# Patient Record
Sex: Male | Born: 1980 | Hispanic: No | Marital: Married | Smoking: Never smoker
Health system: Southern US, Community
[De-identification: ages and names within clinical notes are randomized; demographics above are authoritative.]

## PROBLEM LIST (undated history)

## (undated) DIAGNOSIS — Z87442 Personal history of urinary calculi: Secondary | ICD-10-CM

## (undated) DIAGNOSIS — E785 Hyperlipidemia, unspecified: Secondary | ICD-10-CM

## (undated) DIAGNOSIS — I10 Essential (primary) hypertension: Secondary | ICD-10-CM

## (undated) HISTORY — DX: Hyperlipidemia, unspecified: E78.5

## (undated) HISTORY — DX: Essential (primary) hypertension: I10

## (undated) HISTORY — DX: Personal history of urinary calculi: Z87.442

---

## 2018-01-18 DIAGNOSIS — Z87442 Personal history of urinary calculi: Secondary | ICD-10-CM | POA: Insufficient documentation

## 2018-01-18 DIAGNOSIS — I10 Essential (primary) hypertension: Secondary | ICD-10-CM | POA: Insufficient documentation

## 2021-03-11 ENCOUNTER — Encounter: Payer: Self-pay | Admitting: Family Medicine

## 2021-03-11 ENCOUNTER — Ambulatory Visit (INDEPENDENT_AMBULATORY_CARE_PROVIDER_SITE_OTHER): Payer: Managed Care, Other (non HMO) | Admitting: Family Medicine

## 2021-03-11 ENCOUNTER — Other Ambulatory Visit: Payer: Self-pay

## 2021-03-11 ENCOUNTER — Ambulatory Visit (INDEPENDENT_AMBULATORY_CARE_PROVIDER_SITE_OTHER): Payer: Managed Care, Other (non HMO)

## 2021-03-11 VITALS — BP 167/99 | HR 60 | Temp 97.9°F | Ht 64.96 in | Wt 190.8 lb

## 2021-03-11 DIAGNOSIS — I10 Essential (primary) hypertension: Secondary | ICD-10-CM | POA: Diagnosis not present

## 2021-03-11 DIAGNOSIS — R0789 Other chest pain: Secondary | ICD-10-CM | POA: Insufficient documentation

## 2021-03-11 MED ORDER — AMLODIPINE BESYLATE 10 MG PO TABS: 10.0000 mg | ORAL_TABLET | Freq: Every day | ORAL | 1 refills | Status: AC

## 2021-03-11 MED ORDER — MELOXICAM 15 MG PO TABS: 15.0000 mg | ORAL_TABLET | Freq: Every day | ORAL | 1 refills | Status: AC | PRN

## 2021-03-11 MED ORDER — OLMESARTAN MEDOXOMIL 20 MG PO TABS: 20.0000 mg | ORAL_TABLET | Freq: Every day | ORAL | 1 refills | Status: DC

## 2021-03-11 NOTE — Patient Instructions (Signed)
Nice to meet you today!  Continue amlodipine for blood pressure  Start olmesartan as well for blood pressure  Use meloxicam daily as needed for pain.   We'll be in touch with lab and xray results.   See me again in 4-6 weeks for BP recheck.

## 2021-03-11 NOTE — Assessment & Plan Note (Signed)
Previous rib injury.  Some TTP along R ribs.  CXR ordered and will provide trial of meloxicam.

## 2021-03-11 NOTE — Assessment & Plan Note (Signed)
BP elevated today.  Continue amlodipine.  Adding olmesartan 20mg  Counseled on low sodium diet.  Return in about 5 weeks (around 04/15/2021) for BP/Rib follow up.

## 2021-03-11 NOTE — Progress Notes (Signed)
Randall Mcneil - 40 y.o. male MRN [id number]  Date of birth: 12-28-1980  Subjective Chief Complaint  Patient presents with  . Establish Care   Due to language barrier, an interpreter was present during the history-taking and subsequent discussion (and for part of the physical exam) with this patient.  HPI Randall Mcneil is a 40 y.o. male here today for initial visit to establish care.  He has a history of HTN.  He has complaint of rib and side pain as well.   Reports falling from a ladder in 2005.  May have broken ribs on the R side at that time but xrays were indeterminate.  He has had recurrent pain along the R lower ribs since that time. This is worse when bending over or leaning against something.  He also has occasional R sided abdominal pain.  Denies nausea or vomiting, changes to appetite or changes to bowels.  No worsening or improvement with eating.   HTN currently treated with amlodipine 10mg  daily.  BP at home remains elevated and is elevated in clinic today.  He reports that he is taking medication daily.  He denies side effects related to medication.  He has not had chest tightness, shortness of breath, headache or vision changes.   ROS:  A comprehensive ROS was completed and negative except as noted per HPI    No Known Allergies  Past Medical History:  Diagnosis Date  . History of kidney stones   . Hyperlipidemia   . Hypertension     History reviewed. No pertinent surgical history.  Social History   Socioeconomic History  . Marital status: Married    Spouse name: Not on file  . Number of children: Not on file  . Years of education: Not on file  . Highest education level: Not on file  Occupational History  . Not on file  Tobacco Use  . Smoking status: Never Smoker  . Smokeless tobacco: Never Used  Vaping Use  . Vaping Use: Never used  Substance and Sexual Activity  . Alcohol use: Never  . Drug use: Never  . Sexual activity: Yes    Partners: Female    Birth  control/protection: Condom  Other Topics Concern  . Not on file  Social History Narrative  . Not on file   Social Determinants of Health   Financial Resource Strain: Not on file  Food Insecurity: Not on file  Transportation Needs: Not on file  Physical Activity: Not on file  Stress: Not on file  Social Connections: Not on file    Family History  Problem Relation Age of Onset  . Hypotension Mother     Health Maintenance  Topic Date Due  . Hepatitis C Screening  Never done  . HIV Screening  Never done  . COVID-19 Vaccine (3 - Booster for Pfizer series) 10/31/2020  . INFLUENZA VACCINE  08/25/2021 (Originally 07/25/2020)  . TETANUS/TDAP  01/25/2026  . HPV VACCINES  Aged Out     ----------------------------------------------------------------------------------------------------------------------------------------------------------------------------------------------------------------- Physical Exam BP (!) 167/99 (BP Location: Left Arm, Patient Position: Sitting, Cuff Size: Normal)   Pulse 60   Temp 97.9 F (36.6 C)   Ht 5' 4.96" (1.65 m)   Wt 190 lb 12.8 oz (86.5 kg)   SpO2 99%   BMI 31.79 kg/m   Physical Exam Constitutional:      Appearance: Normal appearance.  Eyes:     General: No scleral icterus. Cardiovascular:     Rate and Rhythm: Normal rate and regular rhythm.  Pulmonary:     Effort: Pulmonary effort is normal.     Breath sounds: Normal breath sounds.  Chest:     Chest wall: Tenderness (TTP along R lower ribs. ) present.  Abdominal:     General: Abdomen is flat. There is no distension.     Palpations: Abdomen is soft.     Tenderness: There is no abdominal tenderness.  Musculoskeletal:     Cervical back: Neck supple.  Neurological:     Mental Status: He is alert.  Psychiatric:        Mood and Affect: Mood normal.        Behavior: Behavior normal.      ------------------------------------------------------------------------------------------------------------------------------------------------------------------------------------------------------------------- Assessment and Plan  Essential hypertension BP elevated today.  Continue amlodipine.  Adding olmesartan 20mg  Counseled on low sodium diet.  Return in about 5 weeks (around 04/15/2021) for BP/Rib follow up.   Chest wall pain Previous rib injury.  Some TTP along R ribs.  CXR ordered and will provide trial of meloxicam.    Meds ordered this encounter  Medications  . amLODipine (NORVASC) 10 MG tablet    Sig: Take 1 tablet (10 mg total) by mouth daily.    Dispense:  90 tablet    Refill:  1  . olmesartan (BENICAR) 20 MG tablet    Sig: Take 1 tablet (20 mg total) by mouth daily.    Dispense:  90 tablet    Refill:  1  . meloxicam (MOBIC) 15 MG tablet    Sig: Take 1 tablet (15 mg total) by mouth daily as needed for pain.    Dispense:  30 tablet    Refill:  1    Return in about 5 weeks (around 04/15/2021) for BP/Rib follow up.    This visit occurred during the SARS-CoV-2 public health emergency.  Safety protocols were in place, including screening questions prior to the visit, additional usage of staff PPE, and extensive cleaning of exam room while observing appropriate contact time as indicated for disinfecting solutions.

## 2021-03-12 LAB — CBC WITH DIFFERENTIAL/PLATELET
Absolute Monocytes: 318 cells/uL (ref 200–950)
Basophils Absolute: 78 cells/uL (ref 0–200)
Basophils Relative: 1.3 %
Eosinophils Absolute: 468 cells/uL (ref 15–500)
Eosinophils Relative: 7.8 %
HCT: 43.3 % (ref 38.5–50.0)
Hemoglobin: 14.2 g/dL (ref 13.2–17.1)
Lymphs Abs: 2328 cells/uL (ref 850–3900)
MCH: 28.6 pg (ref 27.0–33.0)
MCHC: 32.8 g/dL (ref 32.0–36.0)
MCV: 87.3 fL (ref 80.0–100.0)
MPV: 11.2 fL (ref 7.5–12.5)
Monocytes Relative: 5.3 %
Neutro Abs: 2808 cells/uL (ref 1500–7800)
Neutrophils Relative %: 46.8 %
Platelets: 247 10*3/uL (ref 140–400)
RBC: 4.96 10*6/uL (ref 4.20–5.80)
RDW: 13.2 % (ref 11.0–15.0)
Total Lymphocyte: 38.8 %
WBC: 6 10*3/uL (ref 3.8–10.8)

## 2021-03-12 LAB — COMPLETE METABOLIC PANEL WITH GFR
AG Ratio: 1.6 (calc) (ref 1.0–2.5)
ALT: 26 U/L (ref 9–46)
AST: 22 U/L (ref 10–40)
Albumin: 4.2 g/dL (ref 3.6–5.1)
Alkaline phosphatase (APISO): 91 U/L (ref 36–130)
BUN: 13 mg/dL (ref 7–25)
CO2: 27 mmol/L (ref 20–32)
Calcium: 8.9 mg/dL (ref 8.6–10.3)
Chloride: 106 mmol/L (ref 98–110)
Creat: 0.67 mg/dL (ref 0.60–1.35)
GFR, Est African American: 138 mL/min/{1.73_m2} (ref >=60)
GFR, Est Non African American: 119 mL/min/{1.73_m2} (ref >=60)
Globulin: 2.6 g/dL (calc) (ref 1.9–3.7)
Glucose, Bld: 85 mg/dL (ref 65–99)
Potassium: 4.2 mmol/L (ref 3.5–5.3)
Sodium: 139 mmol/L (ref 135–146)
Total Bilirubin: 0.4 mg/dL (ref 0.2–1.2)
Total Protein: 6.8 g/dL (ref 6.1–8.1)

## 2021-03-12 LAB — VITAMIN D 25 HYDROXY (VIT D DEFICIENCY, FRACTURES): Vit D, 25-Hydroxy: 20 ng/mL — ABNORMAL LOW (ref 30–100)

## 2021-03-14 ENCOUNTER — Other Ambulatory Visit: Payer: Self-pay | Admitting: Family Medicine

## 2021-03-14 MED ORDER — VITAMIN D (ERGOCALCIFEROL) 1.25 MG (50000 UNIT) PO CAPS: 50000.0000 [IU] | ORAL_CAPSULE | ORAL | 0 refills | Status: AC

## 2021-04-15 ENCOUNTER — Ambulatory Visit: Payer: Managed Care, Other (non HMO) | Admitting: Family Medicine

## 2021-05-08 ENCOUNTER — Other Ambulatory Visit: Payer: Self-pay | Admitting: Family Medicine

## 2021-07-21 NOTE — Telephone Encounter (Signed)
This encounter was created in error - please disregard.

## 2021-09-09 IMAGING — DX DG CHEST 2V
2 series · 2 of 2 positions shown · non-contrast
Comparison: None.

CLINICAL DATA: Right chest wall pain

Prior rib injury
EXAM:
CHEST - 2 VIEW

[chest pa]
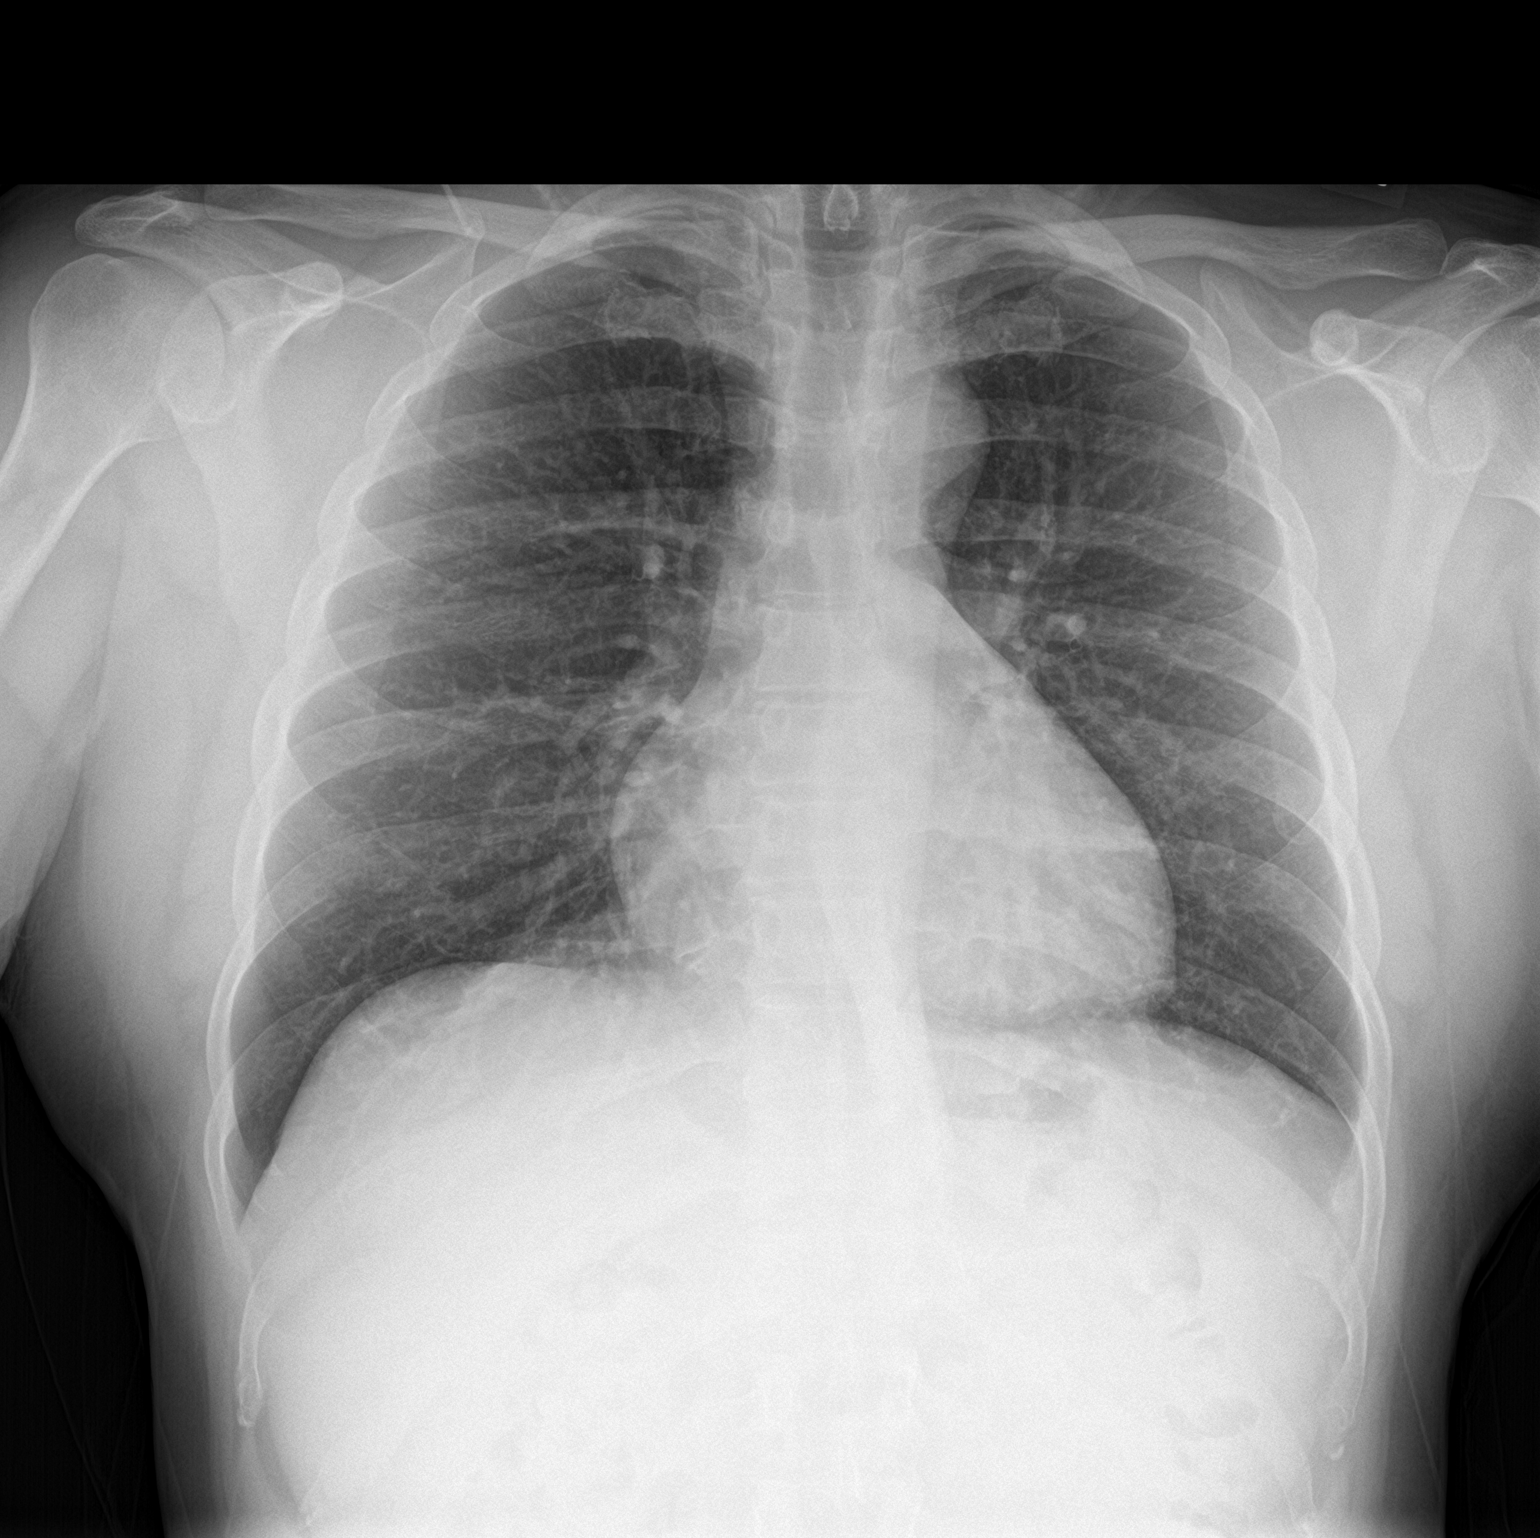

[chest lat]
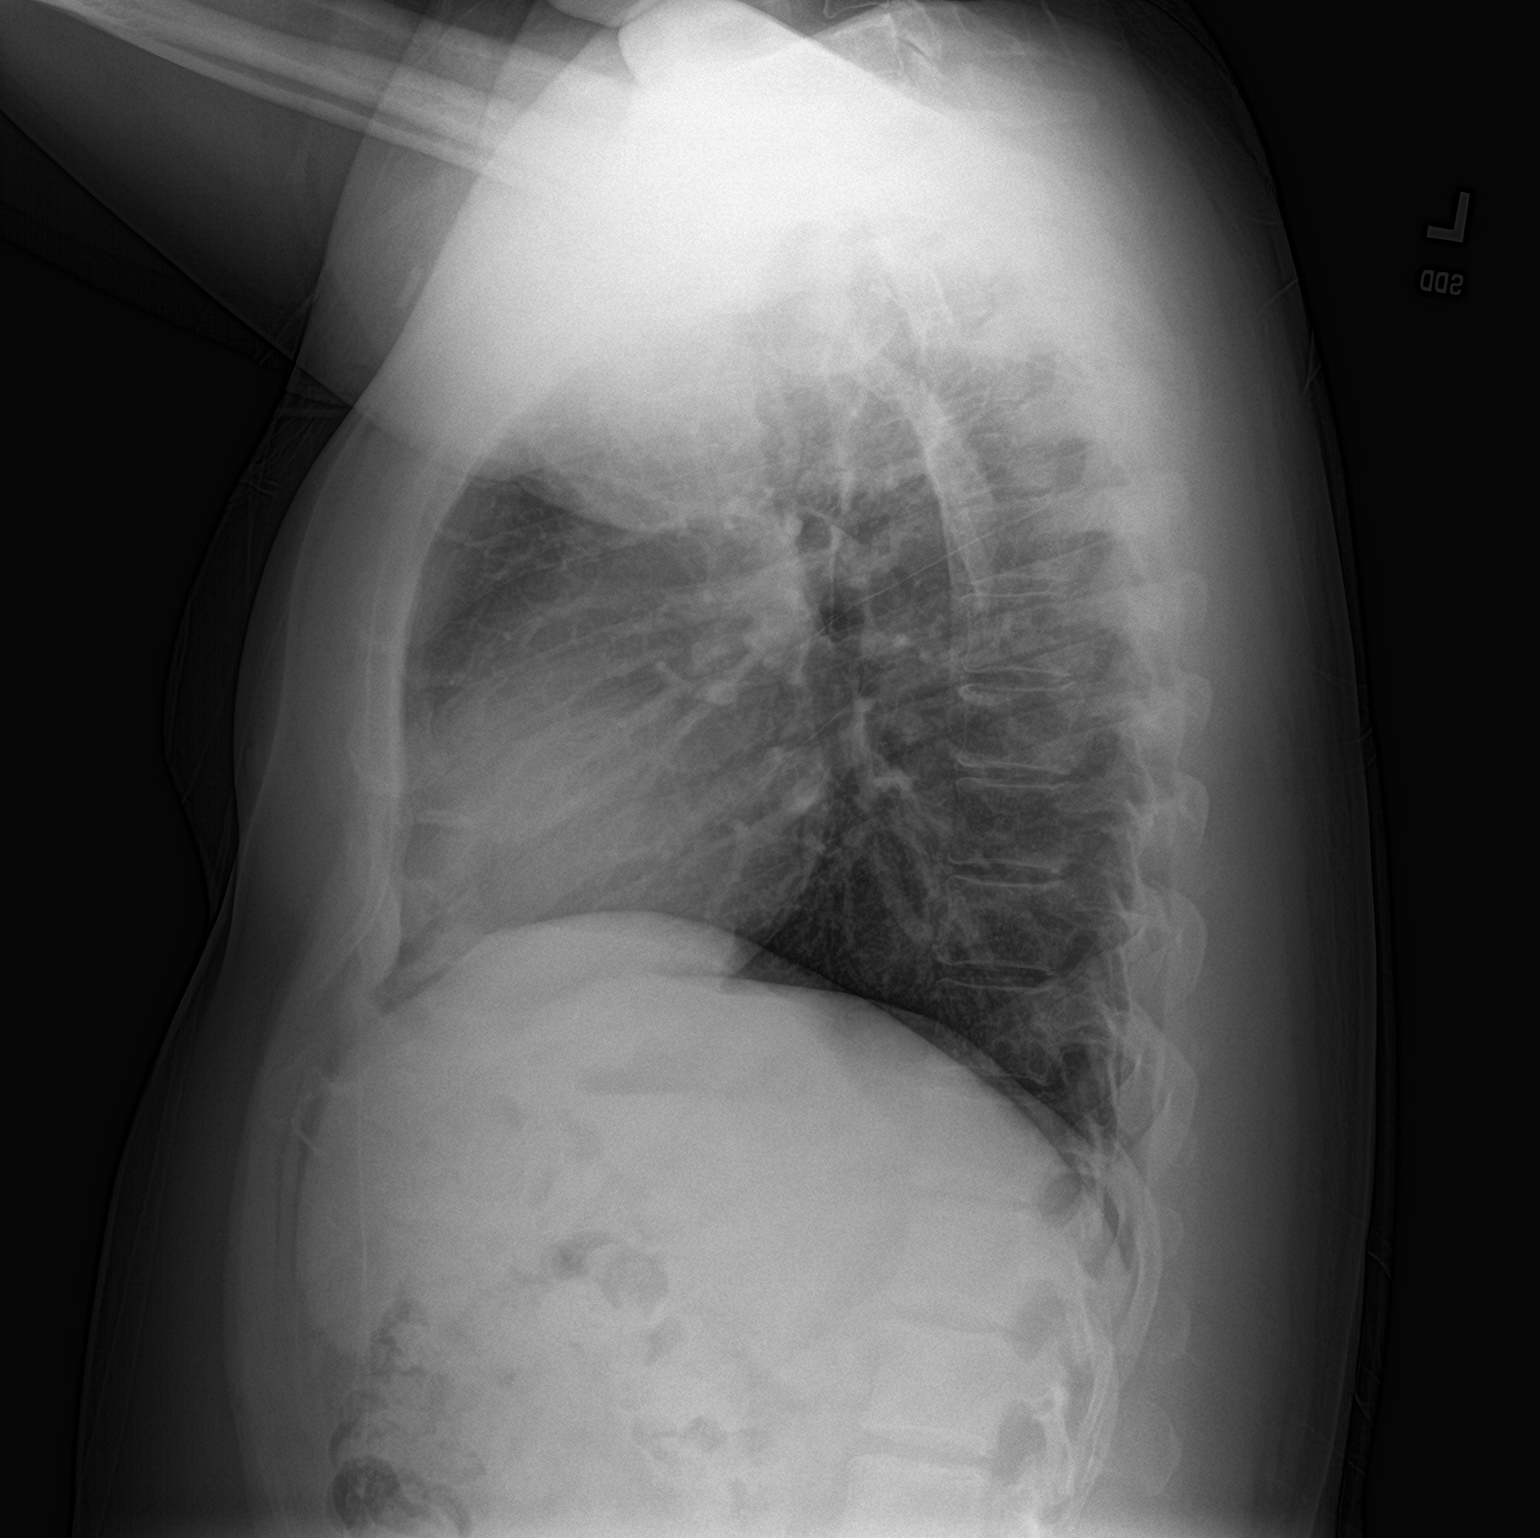

[2 of 2 positions shown; findings below may reference images not displayed]

FINDINGS: The heart size and mediastinal contours are within normal limits.
Both lungs are clear. The visualized skeletal structures are
unremarkable.
IMPRESSION: No active cardiopulmonary disease.

## 2021-09-24 ENCOUNTER — Other Ambulatory Visit: Payer: Self-pay | Admitting: Family Medicine

## 2022-07-25 ENCOUNTER — Other Ambulatory Visit: Payer: Self-pay | Admitting: Family Medicine
